# Patient Record
Sex: Female | Born: 1980 | Race: White | Hispanic: No | Marital: Single
Health system: Southern US, Community
[De-identification: ages and names within clinical notes are randomized; demographics above are authoritative.]

---

## 2018-09-16 ENCOUNTER — Emergency Department (HOSPITAL_COMMUNITY)
Admission: EM | Admit: 2018-09-16 | Discharge: 2018-09-16 | Disposition: A | Discharge status: Home / Self Care | Payer: Managed Care, Other (non HMO) | Attending: Emergency Medicine | Admitting: Emergency Medicine

## 2018-09-16 ENCOUNTER — Encounter (HOSPITAL_COMMUNITY): Payer: Self-pay

## 2018-09-16 ENCOUNTER — Emergency Department (HOSPITAL_COMMUNITY): Payer: Managed Care, Other (non HMO)

## 2018-09-16 ENCOUNTER — Other Ambulatory Visit: Payer: Self-pay

## 2018-09-16 DIAGNOSIS — Y999 Unspecified external cause status: Secondary | ICD-10-CM | POA: Diagnosis not present

## 2018-09-16 DIAGNOSIS — W268XXA Contact with other sharp object(s), not elsewhere classified, initial encounter: Secondary | ICD-10-CM | POA: Insufficient documentation

## 2018-09-16 DIAGNOSIS — S61212A Laceration without foreign body of right middle finger without damage to nail, initial encounter: Secondary | ICD-10-CM | POA: Insufficient documentation

## 2018-09-16 DIAGNOSIS — Y93G1 Activity, food preparation and clean up: Secondary | ICD-10-CM | POA: Insufficient documentation

## 2018-09-16 DIAGNOSIS — S6991XA Unspecified injury of right wrist, hand and finger(s), initial encounter: Secondary | ICD-10-CM | POA: Diagnosis present

## 2018-09-16 DIAGNOSIS — S61214A Laceration without foreign body of right ring finger without damage to nail, initial encounter: Secondary | ICD-10-CM | POA: Diagnosis not present

## 2018-09-16 DIAGNOSIS — Y929 Unspecified place or not applicable: Secondary | ICD-10-CM | POA: Insufficient documentation

## 2018-09-16 DIAGNOSIS — S61209A Unspecified open wound of unspecified finger without damage to nail, initial encounter: Secondary | ICD-10-CM

## 2018-09-16 MED ORDER — OXYCODONE-ACETAMINOPHEN 5-325 MG PO TABS
1.0000 | ORAL_TABLET | Freq: Once | ORAL | Status: AC
  Administered 2018-09-16: 1 via ORAL
  Filled 2018-09-16: qty 1

## 2018-09-16 MED ORDER — OXYCODONE-ACETAMINOPHEN 5-325 MG PO TABS
1.0000 | ORAL_TABLET | ORAL | Status: AC | PRN
  Administered 2018-09-16 (×2): 1 via ORAL
  Filled 2018-09-16 (×2): qty 1

## 2018-09-16 MED ORDER — LIDOCAINE HCL (PF) 1 % IJ SOLN
5.0000 mL | Freq: Once | INTRAMUSCULAR | Status: AC
  Administered 2018-09-16: 5 mL
  Filled 2018-09-16: qty 5

## 2018-09-16 NOTE — ED Triage Notes (Signed)
Using a mandolin & sliced the pad of her middle finger partly off, the pad is hanging by skin & the ring finger has a cut on the tip of it as well.

## 2018-09-16 NOTE — Discharge Instructions (Signed)
Please read instructions below.  Keep your wound clean and covered. In 24 hours, you can get your wound wet; gently clean it with soap and water, pat it dry, and reapply a clean bandage. You can take ibuprofen/advil every 6 hours as needed for pain Follow up with the hand specialist next week for wound recheck.  You will need your sutures removed in about 7 days. If your wound re-bleeds, apply steady pressure to the wound for multiple minutes. Return to the ER for fever, pus draining from wound, redness, or new or worsening symptoms.

## 2018-09-16 NOTE — ED Provider Notes (Signed)
[facility] EMERGENCY DEPARTMENT Provider Note   CSN: [id number] Arrival date & time: 09/16/18  1849     History   Chief Complaint Chief Complaint  Patient presents with  . Finger Injury    HPI Tammie Benson is a 38 y.o. female without significant past medical history, presenting to the emergency department with acute onset of right hand lacerations that occurred prior to arrival.  Patient states she was cooking, using a mandolin.  She states she sliced the ends of her middle and ring finger on the right hand.  She has had bleeding and pain to the digit since that time.  No medications tried prior to arrival, however oxycodone given in triage provided some relief.  Last tetanus shot was done within 3 years from now.  No history of immunocompromise.  She is right-hand dominant.  The history is provided by the patient.    History reviewed. No pertinent past medical history.  There are no active problems to display for this patient.   History reviewed. No pertinent surgical history.   OB History   No obstetric history on file.      Home Medications    Prior to Admission medications   Not on File    Family History History reviewed. No pertinent family history.  Social History Social History   Tobacco Use  . Smoking status: Not on file  Substance Use Topics  . Alcohol use: Not on file  . Drug use: Not on file     Allergies   Patient has no allergy information on record.   Review of Systems Review of Systems  Skin: Positive for wound.  Allergic/Immunologic: Negative for immunocompromised state.     Physical Exam Updated Vital Signs BP 109/78 (BP Location: Right Arm)   Pulse (!) 57   Temp 98.8 F (37.1 C) (Oral)   Resp 16   Ht 5\' 7"  (1.702 m)   Wt 65.8 kg   LMP 09/02/2018   SpO2 100%   BMI 22.71 kg/m   Physical Exam Vitals signs and nursing note reviewed.  Constitutional:      General: She is not in acute distress.  Appearance: She is well-developed.  HENT:     Head: Normocephalic and atraumatic.  Eyes:     Conjunctiva/sclera: Conjunctivae normal.  Cardiovascular:     Rate and Rhythm: Normal rate.  Pulmonary:     Effort: Pulmonary effort is normal.  Musculoskeletal:     Comments: Intact radial pulse to right hand. Avulsion laceration, about 1.5 cm in length, to the distal tip of the right third digit on the palmar aspect.  Laceration does not involve the nail or nailbed.  Avulsed skin flap is still attached at the distal-most aspect of the finger tip, appears dusky and has decreased sensation.  Wound is slowly bleeding, however hemostasis achieved with direct pressure.  There is no obvious foreign body.  Wound does not appear contaminated. There is a small avulsion laceration to the distal tip of the right fourth digit on the palmar aspect.  Wound is slowly bleeding, however hemostasis achieved with direct pressure.  Not grossly contaminated.  The laceration is about 3 mm in width and 1 cm in length, superficial.  Normal distal sensation.  Brisk capillary refill to all digits.  Full active normal range of motion.  Neurological:     Mental Status: She is alert.  Psychiatric:        Mood and Affect: Mood normal.  Behavior: Behavior normal.      ED Treatments / Results  Labs (all labs ordered are listed, but only abnormal results are displayed) Labs Reviewed - No data to display  EKG None  Radiology Dg Hand Complete Right  Result Date: 09/16/2018 CLINICAL DATA:  Cut heads of the third and fourth digits with a mantle in. EXAM: RIGHT HAND - COMPLETE 3+ VIEW COMPARISON:  None. FINDINGS: There is no evidence of fracture or dislocation. There is no evidence of arthropathy or other focal bone abnormality. Soft tissue lacerations along the volar tip of the third and fourth digits are identified. No underlying osseous involvement. No radiopaque foreign body. IMPRESSION: Soft tissue lacerations along  the volar tip of the third and fourth digits without underlying osseous involvement. Electronically Signed   By: Tollie Ethavid  Kwon M.D.   On: 09/16/2018 20:58    Procedures .Marland Kitchen.Laceration Repair Date/Time: 09/16/2018 10:52 PM Performed by: Rilla Buckman, [place] N, PA-C Authorized by: Kawan Vallad[place], [place] N, PA-C   Consent:    Consent obtained:  Verbal   Consent given by:  Patient   Risks discussed:  Infection, pain, poor cosmetic result and poor wound healing   Alternatives discussed:  No treatment Anesthesia (see MAR for exact dosages):    Anesthesia method:  Nerve block   Block needle gauge:  25 G   Block anesthetic:  Lidocaine 1% w/o epi   Block injection procedure:  Anatomic landmarks palpated   Block outcome:  Incomplete block Laceration details:    Location:  Finger   Finger location:  R long finger   Length (cm):  1.5 Repair type:    Repair type:  Simple Pre-procedure details:    Preparation:  Patient was prepped and draped in usual sterile fashion and imaging obtained to evaluate for foreign bodies Exploration:    Hemostasis achieved with:  Direct pressure   Wound exploration: entire depth of wound probed and visualized     Wound extent: no foreign bodies/material noted, no tendon damage noted and no underlying fracture noted     Contaminated: no   Treatment:    Area cleansed with:  Saline   Amount of cleaning:  Standard   Irrigation solution:  Sterile saline   Visualized foreign bodies/material removed: no   Skin repair:    Repair method:  Sutures   Suture size:  5-0   Suture material:  Prolene   Suture technique:  Simple interrupted   Number of sutures:  5 Approximation:    Approximation:  Close Post-procedure details:    Dressing:  Non-adherent dressing   Patient tolerance of procedure:  Tolerated well, no immediate complications   (including critical care time)  Medications Ordered in ED Medications  oxyCODONE-acetaminophen (PERCOCET/ROXICET) 5-325 MG per tablet 1  tablet (1 tablet Oral Given 09/16/18 2025)  lidocaine (PF) (XYLOCAINE) 1 % injection 5 mL (5 mLs Infiltration Given by Other 09/16/18 2025)  oxyCODONE-acetaminophen (PERCOCET/ROXICET) 5-325 MG per tablet 1 tablet (1 tablet Oral Given 09/16/18 2236)     Initial Impression / Assessment and Plan / ED Course  I have reviewed the triage vital signs and the nursing notes.  Pertinent labs & imaging results that were available during my care of the patient were reviewed by me and considered in my medical decision making (see chart for details).    Pt with avulsion laceration to third digit of the right hand, and small laceration to the fourth digit that occurred while using a mandolin cutting tool prior to arrival. Preserved active  and resistive ROM. Wound explored and base of wound visualized in a bloodless field without evidence of foreign body.  The avulsed skin flap does appear dusky.  Discussed with patient that tissue will likely fall off, and wound may require extended healing time. Laceration occurred < 8 hours prior to repair which was well tolerated.  Tdap up-to-date. Pt has  no comorbidities to effect normal wound healing. Pt discharged  without antibiotics.  Discussed suture home care with patient and answered questions. Pt to follow-up for wound check and suture removal in 7 days; they are to return to the ED sooner for signs of infection.  Hand specialist referral provided for follow-up.  Pt is hemodynamically stable with no complaints prior to dc.   Discussed results, findings, treatment and follow up. Patient advised of return precautions. Patient verbalized understanding and agreed with plan.  Final Clinical Impressions(s) / ED Diagnoses   Final diagnoses:  Fingertip avulsion, initial encounter  Laceration of right ring finger without foreign body without damage to nail, initial encounter    ED Discharge Orders    None       Danni Shima, [place] N, PA-C 09/16/18 2257    Little,  Ambrose [place], MD 09/18/18 [phone]

## 2018-09-16 NOTE — ED Notes (Signed)
Patient verbalizes understanding of discharge instructions. Opportunity for questioning and answers were provided. Armband removed by staff, pt discharged from ED ambulatory.   

## 2020-02-01 IMAGING — DX DG HAND COMPLETE 3+V*R*
3 series · 3 of 3 positions shown · non-contrast
Comparison: None.

CLINICAL DATA: Cut heads of the third and fourth digits with a
mantle in.

EXAM:
RIGHT HAND - COMPLETE 3+ VIEW

[hand pa]
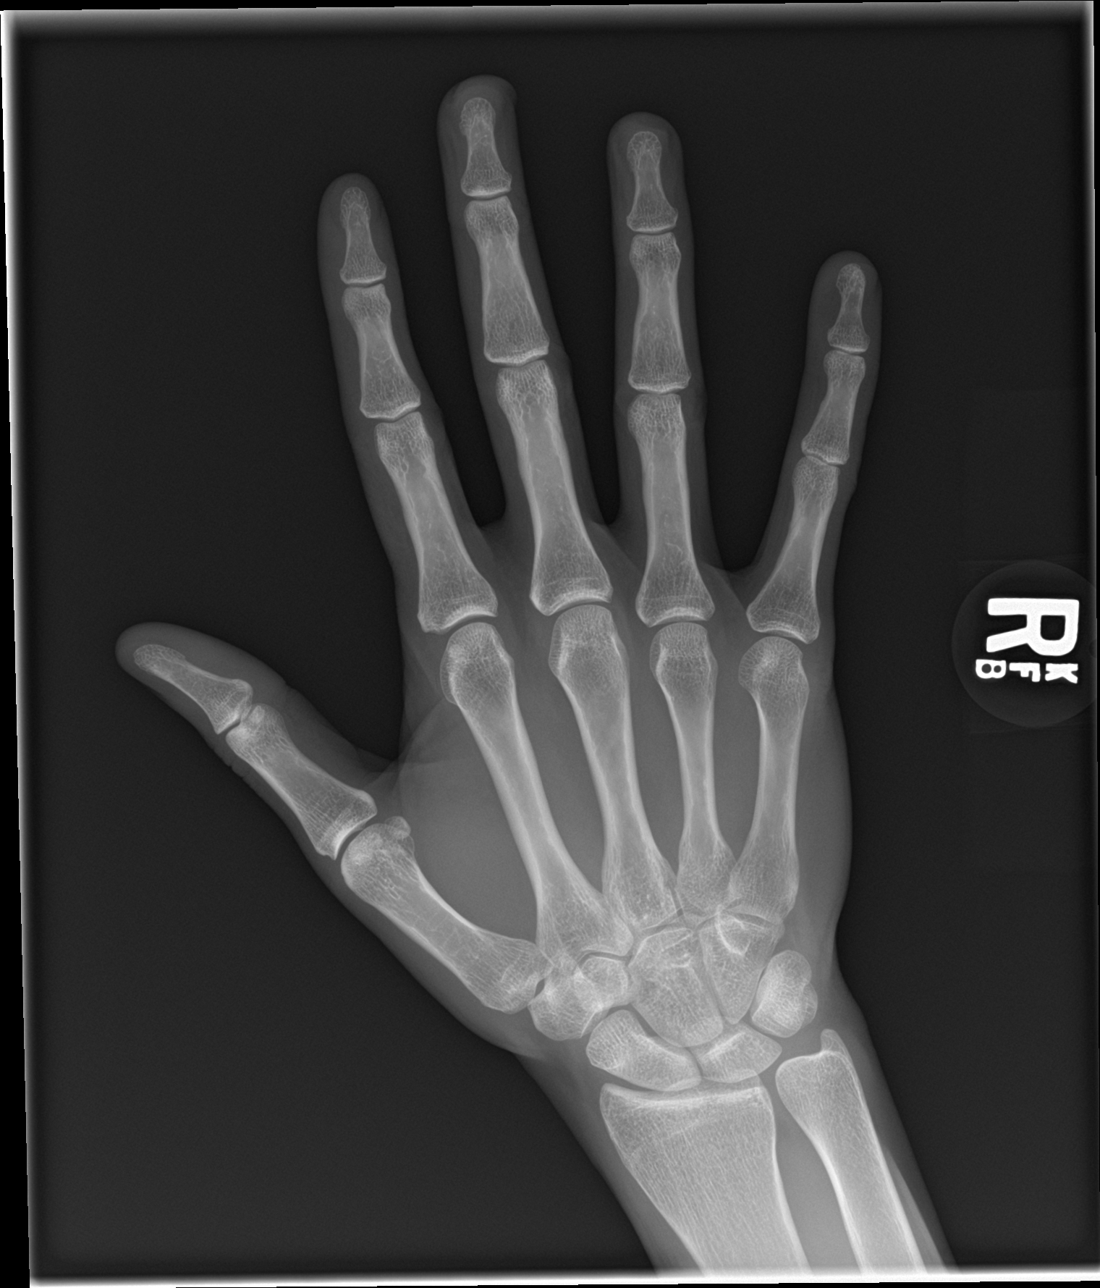

[hand obl]
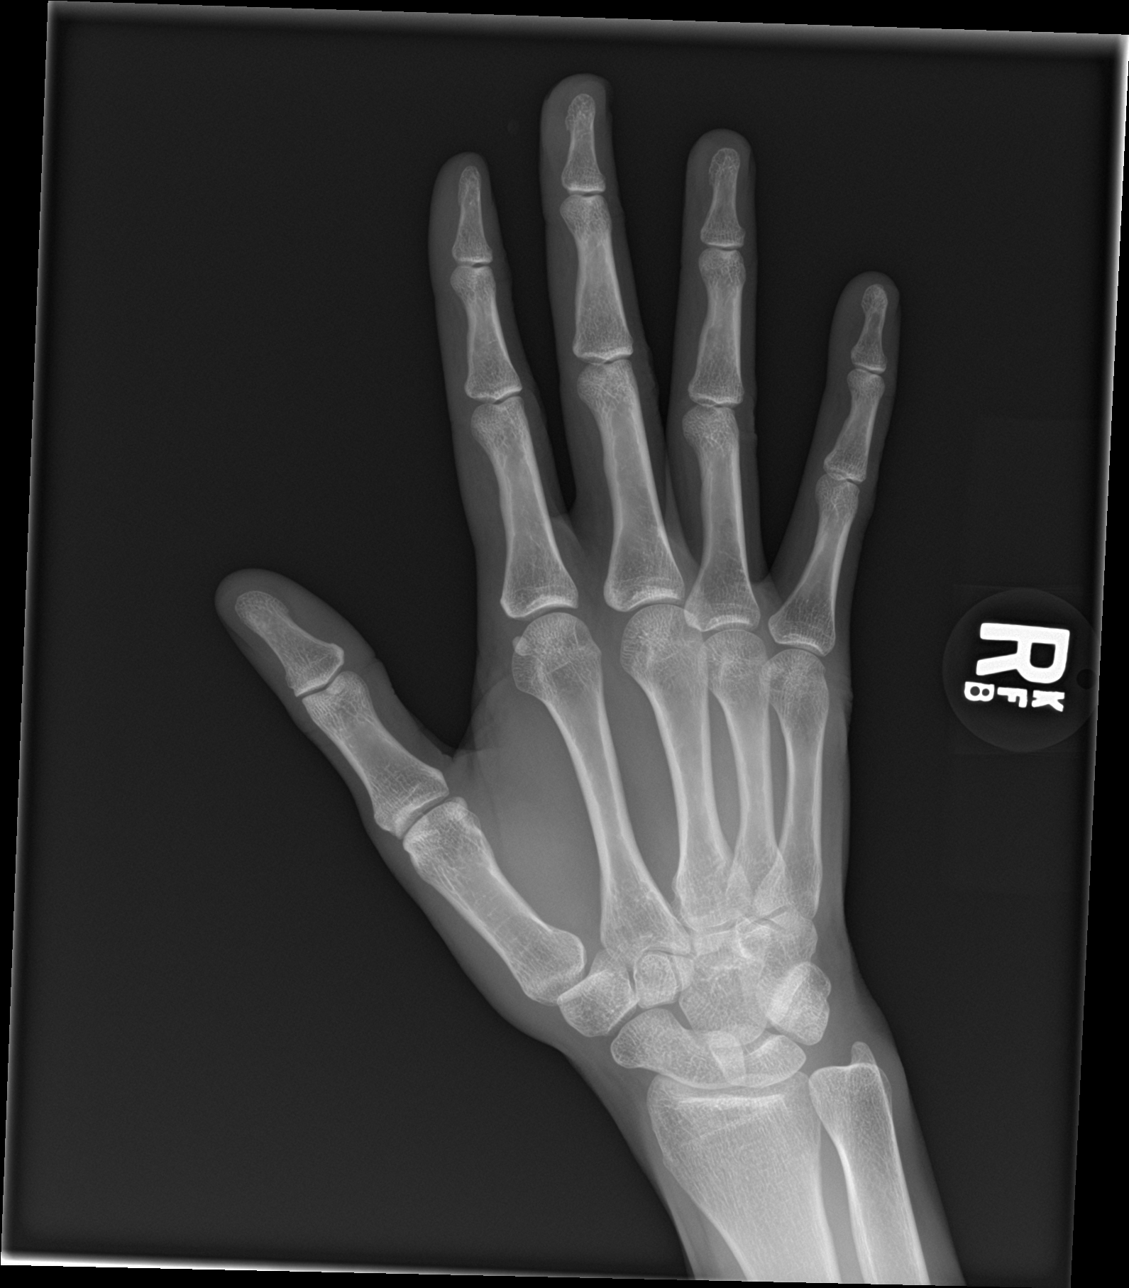

[hand lat]
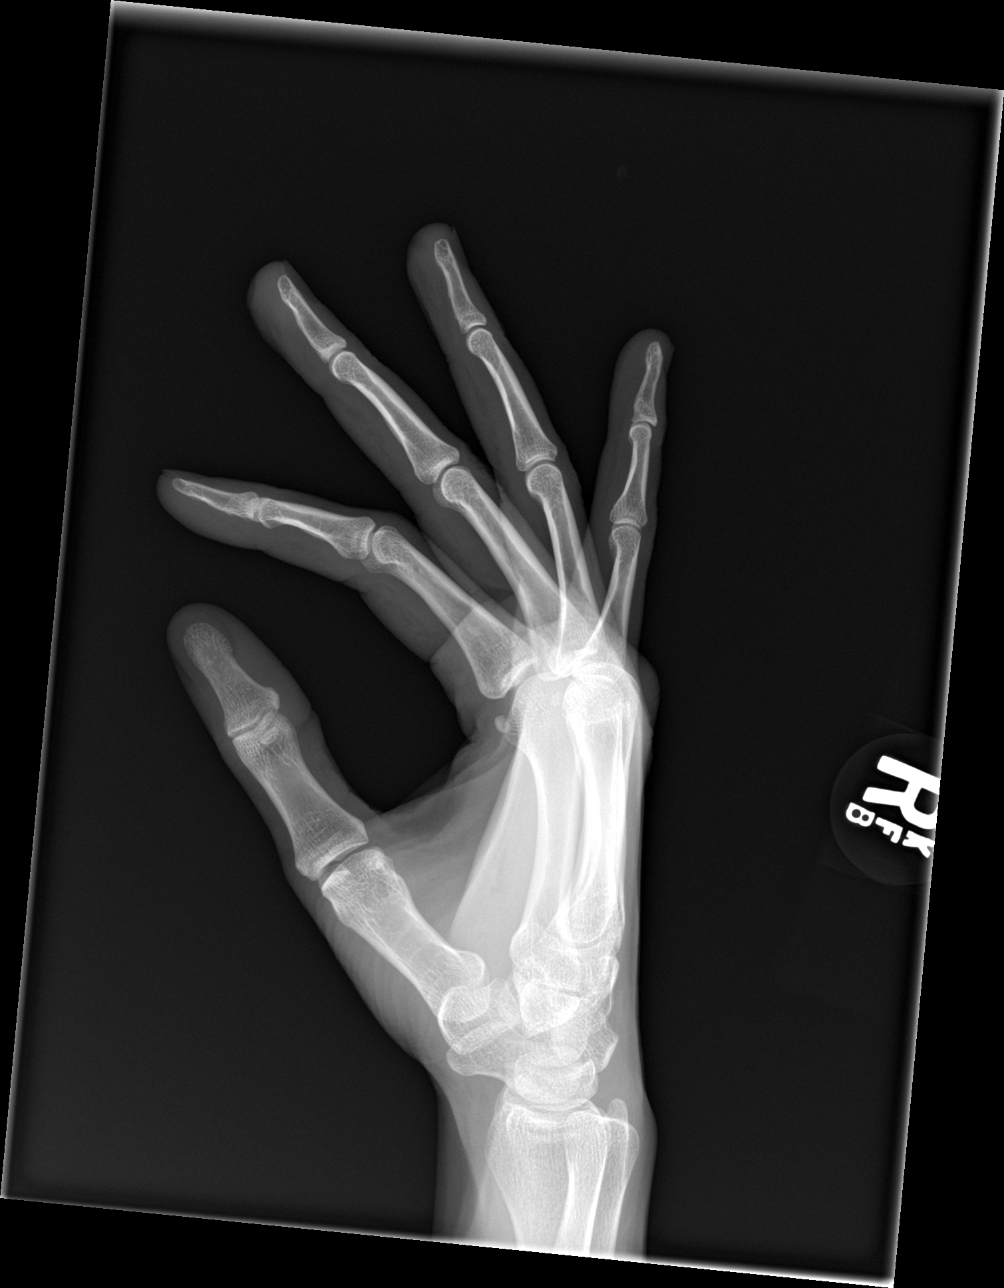

[3 of 3 positions shown; findings below may reference images not displayed]

FINDINGS: There is no evidence of fracture or dislocation. There is no
evidence of arthropathy or other focal bone abnormality. Soft tissue
lacerations along the volar tip of the third and fourth digits are
identified. No underlying osseous involvement. No radiopaque foreign
body.
IMPRESSION: Soft tissue lacerations along the volar tip of the third and fourth
digits without underlying osseous involvement.
